# Patient Record
Sex: Female | Born: 2000 | Race: White | Hispanic: No | Marital: Single | Smoking: Never smoker
Health system: Southern US, Community
[De-identification: ages and names within clinical notes are randomized; demographics above are authoritative.]

## PROBLEM LIST (undated history)

## (undated) DIAGNOSIS — F909 Attention-deficit hyperactivity disorder, unspecified type: Secondary | ICD-10-CM

## (undated) DIAGNOSIS — R110 Nausea: Secondary | ICD-10-CM

## (undated) DIAGNOSIS — R109 Unspecified abdominal pain: Secondary | ICD-10-CM

## (undated) HISTORY — DX: Nausea: R11.0

## (undated) HISTORY — PX: WISDOM TOOTH EXTRACTION: SHX21

## (undated) HISTORY — DX: Unspecified abdominal pain: R10.9

---
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
## 2019-10-11 DIAGNOSIS — F419 Anxiety disorder, unspecified: Secondary | ICD-10-CM | POA: Insufficient documentation

## 2020-05-10 IMAGING — DX PORTABLE CHEST - 1 VIEW
1 series · 1 of 1 positions shown · non-contrast
Comparison: June 29, 2004.

CLINICAL DATA: Shortness of breath with cough and fever

EXAM:
PORTABLE CHEST 1 VIEW

[chest]
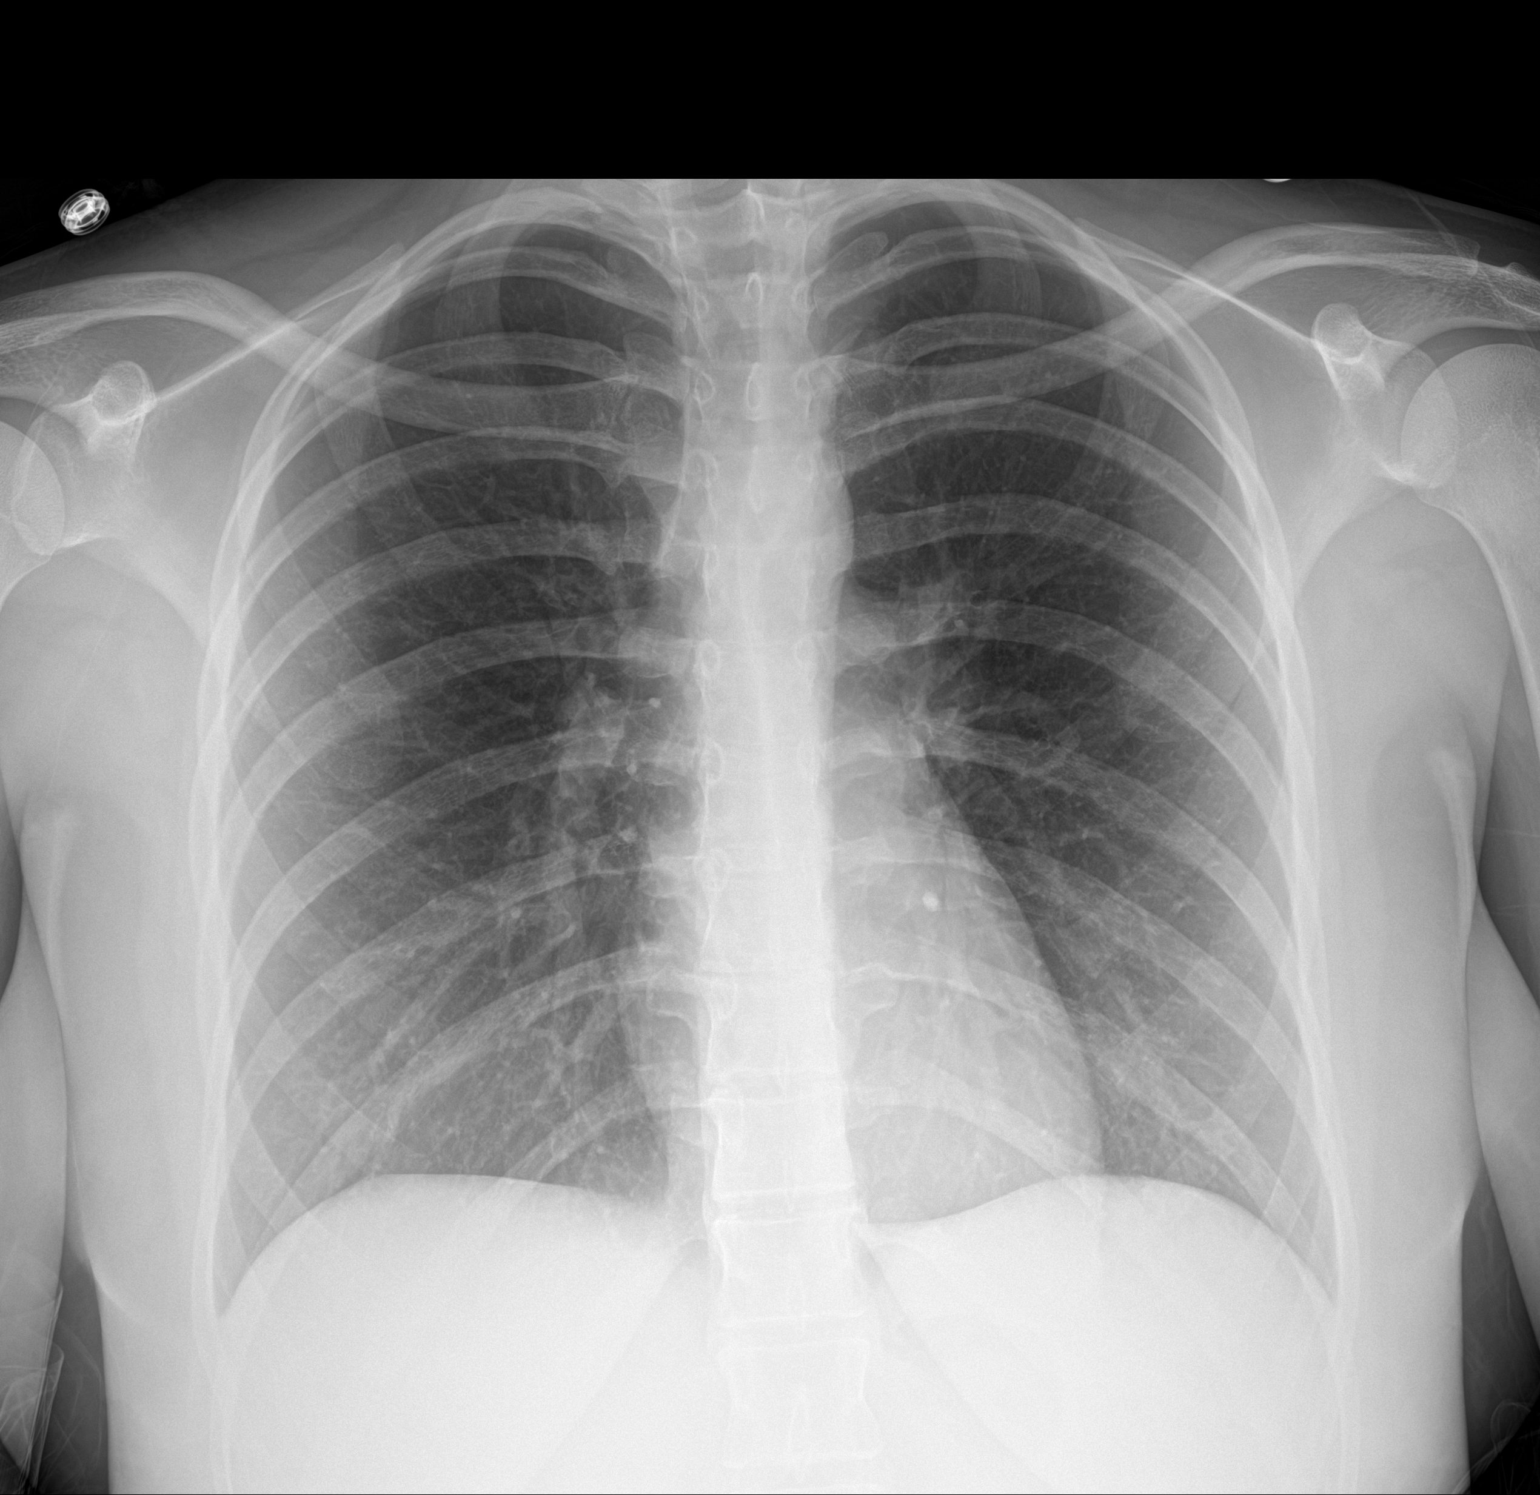

[1 of 1 positions shown; findings below may reference images not displayed]

FINDINGS: Lungs are clear. Heart size and pulmonary vascularity are normal. No
adenopathy. No bone lesions.
IMPRESSION: No edema or consolidation.

## 2021-05-16 ENCOUNTER — Ambulatory Visit (HOSPITAL_BASED_OUTPATIENT_CLINIC_OR_DEPARTMENT_OTHER): Payer: Managed Care, Other (non HMO) | Admitting: Obstetrics & Gynecology

## 2021-05-16 VITALS — BP 119/80 | HR 93 | Ht 66.0 in | Wt 130.6 lb

## 2021-05-16 DIAGNOSIS — Z3046 Encounter for surveillance of implantable subdermal contraceptive: Secondary | ICD-10-CM | POA: Diagnosis not present

## 2021-05-16 DIAGNOSIS — N925 Other specified irregular menstruation: Secondary | ICD-10-CM | POA: Diagnosis not present

## 2021-05-16 DIAGNOSIS — Z30017 Encounter for initial prescription of implantable subdermal contraceptive: Secondary | ICD-10-CM

## 2021-05-16 DIAGNOSIS — G90A Postural orthostatic tachycardia syndrome (POTS): Secondary | ICD-10-CM

## 2021-05-16 DIAGNOSIS — Z975 Presence of (intrauterine) contraceptive device: Secondary | ICD-10-CM

## 2021-05-16 MED ORDER — ETONOGESTREL 68 MG ~~LOC~~ IMPL
68.0000 mg | DRUG_IMPLANT | Freq: Once | SUBCUTANEOUS | Status: AC
  Administered 2021-05-16: 68 mg via SUBCUTANEOUS

## 2021-05-17 ENCOUNTER — Encounter (HOSPITAL_BASED_OUTPATIENT_CLINIC_OR_DEPARTMENT_OTHER): Payer: Self-pay | Admitting: Obstetrics & Gynecology

## 2021-05-17 DIAGNOSIS — G90A Postural orthostatic tachycardia syndrome (POTS): Secondary | ICD-10-CM | POA: Insufficient documentation

## 2021-05-17 DIAGNOSIS — Z975 Presence of (intrauterine) contraceptive device: Secondary | ICD-10-CM | POA: Insufficient documentation

## 2021-05-17 NOTE — Progress Notes (Signed)
21 y.o.G0 Caucasian Single female presents for  complaint of increased spotting with Nexplanon.  She has recently graduated from [employer] and will be starting a job in [place] in the next few weeks.  She grew up in [place].  Had amenorrhea for over a year with Nexplanon.  Has gradually noted more bleeding in the last couple of months.  Was wondering if could have Nexplanon removed/replaced before moving to [place].  Not due for replacement until September but is unsure of how long will take to get established once moves.  Pt's mother is long-term patient of mine. ? ?Reports she did have first Nexplanon placed but it actually "fell" out.  Part of the rod came through the skin within a day or two of placement.  Was removed and replaced and has really had no problems until the irregular spotting started.  Is using this for contraception. ? ?H/O chronic nausea and POTS.  Reports having normal pap smear done in [place] after turning age 85.  Is aware 3 year follow up recommended.  Pt reports pap smear was normal.   ? ?After all questions were answered, consent was obtained.   ? ?Past Medical History:  ?Diagnosis Date  ? ADHD (attention deficit hyperactivity disorder)   ? Chronic nausea   ? ? ?Past Surgical History:  ?Procedure Laterality Date  ? COLONOSCOPY N/A 08/02/2016  ? Procedure: COLONOSCOPY;  Surgeon: Adelene Amas, MD;  [facility] ENDOSCOPY;  Service: Gastroenterology;  Laterality: N/A;  ? ESOPHAGOGASTRODUODENOSCOPY N/A 08/02/2016  ? Procedure: ESOPHAGOGASTRODUODENOSCOPY (EGD);  Surgeon: Adelene Amas, MD;  Location: [facility] ENDOSCOPY;  Service: Gastroenterology;  Laterality: N/A;  ? WISDOM TOOTH EXTRACTION    ? ? ?Current Outpatient Medications on File Prior to Visit  ?Medication Sig Dispense Refill  ? esomeprazole (NEXIUM) 20 MG capsule Take 20 mg by mouth daily.     ? hyoscyamine (LEVBID) 0.375 MG 12 hr tablet Take 0.375 mg by mouth every 12 (twelve) hours as needed for cramping.     ? linaclotide  (LINZESS) 290 MCG CAPS capsule Take 290 mcg by mouth daily.    ? ondansetron (ZOFRAN-ODT) 8 MG disintegrating tablet Take 8 mg by mouth every 8 (eight) hours as needed for nausea.    ? VYVANSE 20 MG capsule Take 20 mg by mouth daily. ON SCHOOL DAYS    ? ?No current facility-administered medications on file prior to visit.  ? ?No Known Allergies ? ?Vitals:  ? 05/16/21 1612  ?BP: 119/80  ?Pulse: 93  ? ? ?Physical Exam ?Constitutional:   ?   Appearance: Normal appearance.  ?Neurological:  ?   General: No focal deficit present.  ?   Mental Status: She is alert.  ?Psychiatric:     ?   Mood and Affect: Mood normal.  ? ?Procedure: Patient placed supine on exam table with her left arm flexed at the elbow and externally rotated. The location of Nexplanon noted.  Area cleansed with Betadine x 3.  Sterile technique used throughout procedure.  1.5cc 1% lidocaine without epinephrine instilled beneath proximal end of Nexplanon rod.  Using #11 blade, small incision in skin made.  Capsule opened and rod easily visible.  Rod removed intact from arm.  Pt visualized device before discarding.  Then path of insertion site was anesthetized with 1% Lidocaine without epinephrine, 3cc total.  Usine Nexplanon device (and after confirming present of rod in device), device placed through incision, elevating skin along insertion path, passing device just under the skin.  Rod  released and device removed.  Rod palpated easily.  Small amount of bleeding made hemostatic with pressure.  Steri-strips applied and a pressure bandage was place over the site.  Entire procedure performed with sterile technique. ? ?Assessment/Plan: ?1. Encounter for removal and reinsertion of Nexplanon ?- pt tolerated procedure well.  Advised to leave dressing on tonight and then remove in AM.  Can shower tomorrow.  Advised to leave steri strips in place until are loose and then can remove.  Additional post procedure instructions reviewed with pt.  Pt knows to call/contact  office with any concerns or questions.  Pt is aware removal is due by 3 calendar years from today. ?- etonogestrel (NEXPLANON) implant 68 mg ? ?2. Encounter for surveillance of implantable subdermal contraceptive ? ? ?
